# Patient Record
Sex: Male | Born: 1995 | Race: White | Hispanic: No | Marital: Single | Smoking: Never smoker
Health system: Southern US, Community
[De-identification: ages and names within clinical notes are randomized; demographics above are authoritative.]

---

## 2015-04-12 ENCOUNTER — Encounter (HOSPITAL_BASED_OUTPATIENT_CLINIC_OR_DEPARTMENT_OTHER): Payer: Self-pay | Admitting: Emergency Medicine

## 2015-04-12 ENCOUNTER — Emergency Department (HOSPITAL_BASED_OUTPATIENT_CLINIC_OR_DEPARTMENT_OTHER)
Admission: EM | Admit: 2015-04-12 | Discharge: 2015-04-12 | Disposition: A | Discharge status: Home / Self Care | Payer: Managed Care, Other (non HMO) | Attending: Emergency Medicine | Admitting: Emergency Medicine

## 2015-04-12 DIAGNOSIS — Z23 Encounter for immunization: Secondary | ICD-10-CM | POA: Insufficient documentation

## 2015-04-12 DIAGNOSIS — S61011A Laceration without foreign body of right thumb without damage to nail, initial encounter: Secondary | ICD-10-CM | POA: Diagnosis not present

## 2015-04-12 DIAGNOSIS — Y9389 Activity, other specified: Secondary | ICD-10-CM | POA: Diagnosis not present

## 2015-04-12 DIAGNOSIS — Y9289 Other specified places as the place of occurrence of the external cause: Secondary | ICD-10-CM | POA: Diagnosis not present

## 2015-04-12 DIAGNOSIS — W260XXA Contact with knife, initial encounter: Secondary | ICD-10-CM | POA: Insufficient documentation

## 2015-04-12 DIAGNOSIS — Y998 Other external cause status: Secondary | ICD-10-CM | POA: Insufficient documentation

## 2015-04-12 DIAGNOSIS — S61219A Laceration without foreign body of unspecified finger without damage to nail, initial encounter: Secondary | ICD-10-CM

## 2015-04-12 MED ORDER — TETANUS-DIPHTH-ACELL PERTUSSIS 5-2.5-18.5 LF-MCG/0.5 IM SUSP
0.5000 mL | Freq: Once | INTRAMUSCULAR | Status: AC
  Administered 2015-04-12: 0.5 mL via INTRAMUSCULAR
  Filled 2015-04-12: qty 0.5

## 2015-04-12 MED ORDER — LIDOCAINE HCL (PF) 1 % IJ SOLN
INTRAMUSCULAR | Status: AC
  Filled 2015-04-12: qty 5

## 2015-04-12 NOTE — Discharge Instructions (Signed)

## 2015-04-12 NOTE — ED Notes (Signed)
V shaped Lac to right thumb from new pocket knife.

## 2015-04-12 NOTE — ED Notes (Signed)
DC instructions reviewed with pt, discussed signs and symptoms of infection, pain control and when to return to ED if needed. Opportunity for questions provided

## 2015-04-12 NOTE — ED Notes (Signed)
PA at bedside.

## 2015-04-12 NOTE — ED Provider Notes (Signed)
CSN: [id number]     Arrival date & time 04/12/15  1708 History   First MD Initiated Contact with Patient 04/12/15 1951     Chief Complaint  Patient presents with  . Extremity Laceration     (Consider location/radiation/quality/duration/timing/severity/associated sxs/prior Treatment) HPI   Patient reports trying to cut off a security tag from a clothing item at home with a brand-new pocket knife when he accidentally slipped and cut his distal right thumb with a pocket knife. It was bleeding a lot but the bleeding has since improved. He denies continually having pain. No significant PMH, unsure about his tetanus. Denies any other injuries. Says knife was clean and new.  History reviewed. No pertinent past medical history. History reviewed. No pertinent past surgical history. No family history on file. Social History  Substance Use Topics  . Smoking status: Never Smoker   . Smokeless tobacco: None  . Alcohol Use: No    Review of Systems  Review of Systems All other systems negative except as documented in the HPI. All pertinent positives and negatives as reviewed in the HPI.   Allergies  Review of patient's allergies indicates no known allergies.  Home Medications   Prior to Admission medications   Not on File   BP 134/78 mmHg  Pulse 90  Temp(Src) 98.9 F (37.2 C) (Oral)  Resp 16  Ht 5\' 10"  (1.778 m)  Wt 102.059 kg  BMI 32.28 kg/m2  SpO2 100% Physical Exam  Constitutional: He appears well-developed and well-nourished. No distress.  HENT:  Head: Normocephalic and atraumatic.  Eyes: Pupils are equal, round, and reactive to light.  Neck: Normal range of motion. Neck supple.  Cardiovascular: Normal rate and regular rhythm.   Pulmonary/Chest: Effort normal.  Abdominal: Soft.  Musculoskeletal:  "V" shaped laceration to anterior distal portion of right thumb, approx 2 cm. Not actively bleeding. Neurovascularly intact and he has full range of motion.  Neurological: He  is alert.  Skin: Skin is warm and dry.  Nursing note and vitals reviewed.   ED Course  Procedures (including critical care time) Labs Review Labs Reviewed - No data to display  Imaging Review No results found. I have personally reviewed and evaluated these images and lab results as part of my medical decision-making.   EKG Interpretation None      MDM   Final diagnoses:  Finger laceration, initial encounter    LACERATION REPAIR Performed by: Dorthula MatasGREENE,Emerson Schreifels G Authorized by: Dorthula MatasGREENE,Sadiyah Kangas G Consent: Verbal consent obtained. Risks and benefits: risks, benefits and alternatives were discussed Consent given by: patient Patient identity confirmed: provided demographic data Prepped and Draped in normal sterile fashion Wound explored  Laceration Location: right thumb  Laceration Length:  2 cm  No Foreign Bodies seen or palpated  Anesthesia: local infiltration  Local anesthetic: lidocaine 1 % wo epinephrine  Anesthetic total: 2 ml  Irrigation method: syringe Amount of cleaning: standard  Skin closure: suture, prolene 4'0  Number of sutures: 7  Technique: simple interrupted.  Patient tolerance: Patient tolerated the procedure well with no immediate complications.  The wound is cleansed, debrided of foreign material as much as possible, and dressed. The patient is alerted to watch for any signs of infection (redness, pus, pain, increased swelling or fever) and call if such occurs. Home wound care instructions are provided. Tetanus vaccination status reviewed: Td vaccination indicated and given today. Stitches to be removed in 10-14 days.     Marlon Peliffany Murad Staples, PA-C 04/16/15 1421  Rolland PorterMark James, MD 04/24/15  0643 

## 2016-06-06 ENCOUNTER — Emergency Department (HOSPITAL_BASED_OUTPATIENT_CLINIC_OR_DEPARTMENT_OTHER): Payer: Managed Care, Other (non HMO)

## 2016-06-06 ENCOUNTER — Encounter (HOSPITAL_BASED_OUTPATIENT_CLINIC_OR_DEPARTMENT_OTHER): Payer: Self-pay | Admitting: Emergency Medicine

## 2016-06-06 ENCOUNTER — Emergency Department (HOSPITAL_BASED_OUTPATIENT_CLINIC_OR_DEPARTMENT_OTHER)
Admission: EM | Admit: 2016-06-06 | Discharge: 2016-06-06 | Disposition: A | Discharge status: Home / Self Care | Payer: Managed Care, Other (non HMO) | Attending: Emergency Medicine | Admitting: Emergency Medicine

## 2016-06-06 DIAGNOSIS — N200 Calculus of kidney: Secondary | ICD-10-CM | POA: Insufficient documentation

## 2016-06-06 DIAGNOSIS — R109 Unspecified abdominal pain: Secondary | ICD-10-CM | POA: Diagnosis present

## 2016-06-06 LAB — CBC WITH DIFFERENTIAL/PLATELET
Basophils Absolute: 0 10*3/uL (ref 0.0–0.1)
Basophils Relative: 0 %
EOS ABS: 0.1 10*3/uL (ref 0.0–0.7)
EOS PCT: 1 %
HCT: 41.4 % (ref 39.0–52.0)
Hemoglobin: 14.3 g/dL (ref 13.0–17.0)
LYMPHS ABS: 2.5 10*3/uL (ref 0.7–4.0)
Lymphocytes Relative: 29 %
MCH: 30.4 pg (ref 26.0–34.0)
MCHC: 34.5 g/dL (ref 30.0–36.0)
MCV: 88.1 fL (ref 78.0–100.0)
MONO ABS: 0.7 10*3/uL (ref 0.1–1.0)
MONOS PCT: 8 %
Neutro Abs: 5.3 10*3/uL (ref 1.7–7.7)
Neutrophils Relative %: 62 %
PLATELETS: 271 10*3/uL (ref 150–400)
RBC: 4.7 MIL/uL (ref 4.22–5.81)
RDW: 12.9 % (ref 11.5–15.5)
WBC: 8.7 10*3/uL (ref 4.0–10.5)

## 2016-06-06 LAB — URINALYSIS, MICROSCOPIC (REFLEX)

## 2016-06-06 LAB — URINALYSIS, ROUTINE W REFLEX MICROSCOPIC
Bilirubin Urine: NEGATIVE
GLUCOSE, UA: NEGATIVE mg/dL
Ketones, ur: NEGATIVE mg/dL
LEUKOCYTES UA: NEGATIVE
Nitrite: NEGATIVE
PH: 6 (ref 5.0–8.0)
Protein, ur: 30 mg/dL — AB
Specific Gravity, Urine: 1.027 (ref 1.005–1.030)

## 2016-06-06 LAB — COMPREHENSIVE METABOLIC PANEL
ALT: 82 U/L — AB (ref 17–63)
AST: 32 U/L (ref 15–41)
Albumin: 4.7 g/dL (ref 3.5–5.0)
Alkaline Phosphatase: 56 U/L (ref 38–126)
Anion gap: 8 (ref 5–15)
BUN: 9 mg/dL (ref 6–20)
CHLORIDE: 107 mmol/L (ref 101–111)
CO2: 25 mmol/L (ref 22–32)
CREATININE: 0.88 mg/dL (ref 0.61–1.24)
Calcium: 9.6 mg/dL (ref 8.9–10.3)
GFR calc Af Amer: >60 mL/min (ref >60)
GFR calc non Af Amer: >60 mL/min (ref >60)
GLUCOSE: 95 mg/dL (ref 65–99)
Potassium: 4 mmol/L (ref 3.5–5.1)
Sodium: 140 mmol/L (ref 135–145)
Total Bilirubin: 0.7 mg/dL (ref 0.3–1.2)
Total Protein: 7.7 g/dL (ref 6.5–8.1)

## 2016-06-06 MED ORDER — TAMSULOSIN HCL 0.4 MG PO CAPS: 0.4000 mg | ORAL_CAPSULE | Freq: Every day | ORAL | 0 refills | Status: AC

## 2016-06-06 MED ORDER — IBUPROFEN 800 MG PO TABS: 800.0000 mg | ORAL_TABLET | Freq: Three times a day (TID) | ORAL | 0 refills | Status: AC

## 2016-06-06 MED ORDER — HYDROCODONE-ACETAMINOPHEN 5-325 MG PO TABS: 1.0000 | ORAL_TABLET | Freq: Four times a day (QID) | ORAL | 0 refills | Status: DC | PRN

## 2016-06-06 MED ORDER — CEPHALEXIN 500 MG PO CAPS: 500.0000 mg | ORAL_CAPSULE | Freq: Four times a day (QID) | ORAL | 0 refills | Status: DC

## 2016-06-06 MED FILL — CEPHALEXIN 500 MG CAPSULE: 500 | 5 days supply | Qty: 20 | Fill #0

## 2016-06-06 MED FILL — IBUPROFEN 800 MG TABLET: 800 | 7 days supply | Qty: 21 | Fill #0

## 2016-06-06 MED FILL — TAMSULOSIN HCL 0.4 MG CAP: 0.4 | 30 days supply | Qty: 30 | Fill #0

## 2016-06-06 MED FILL — HYDROCODON-APAP 5-325: 5-325 | 4 days supply | Qty: 15 | Fill #0

## 2016-06-06 NOTE — ED Provider Notes (Signed)
[facility]   CSN: [id number] Arrival date & time: 06/06/16  1324     History   Chief Complaint Chief Complaint  Patient presents with  . Back Pain    HPI Brian Galloway is a 21 y.o. male.  HPI    Brian Galloway is a 21 y.o. male, patient with no pertinent past medical history, presenting to the ED with Sudden onset of right lower back pain that occurred a little before 9 AM this morning. Patient states that he was sitting on the toilet following a bowel movement when he began to have sudden sharp/stabbing, 10 out of 10, nonradiating right lower back pain. Patient states that he was not straining at the time and had finished with his bowel movement. Patient vomited due to the intensity of pain. The pain lasted about a half an hour during the patient's POV travel to the ED. Patient is currently pain-free. He is concerned about a possible kidney stone. He states he has no history of these, however, his father does. He denies fever/chills, urinary complaints, nausea/constipation/diarrhea, penile discharge, abdominal pain, or any other complaints.     No past medical history on file.  There are no active problems to display for this patient.   No past surgical history on file.     Home Medications    Prior to Admission medications   Medication Sig Start Date End Date Taking? Authorizing Provider  cephALEXin (KEFLEX) 500 MG capsule Take 1 capsule (500 mg total) by mouth 4 (four) times daily. 06/06/16   Parissa Chiao C Camila [place]ville, PA-C  HYDROcodone-acetaminophen (NORCO/VICODIN) 5-325 MG tablet Take 1 tablet by mouth every 6 (six) hours as needed. 06/06/16   Estephan Gallardo C Natausha Jungwirth, PA-C  ibuprofen (ADVIL,MOTRIN) 800 MG tablet Take 1 tablet (800 mg total) by mouth 3 (three) times daily. 06/06/16   Harleyquinn Gasser C Lynnea Vandervoort, PA-C  tamsulosin (FLOMAX) 0.4 MG CAPS capsule Take 1 capsule (0.4 mg total) by mouth daily. 06/06/16   Anselm Pancoast, PA-C    Family History No family history on  file.  Social History Social History  Substance Use Topics  . Smoking status: Never Smoker  . Smokeless tobacco: Never Used  . Alcohol use No     Allergies   Patient has no known allergies.   Review of Systems Review of Systems  Constitutional: Negative for chills and fever.  Respiratory: Negative for shortness of breath.   Cardiovascular: Negative for chest pain.  Gastrointestinal: Positive for vomiting (one episode). Negative for abdominal pain, constipation and diarrhea.  Genitourinary: Negative for difficulty urinating, discharge, dysuria, hematuria, penile pain, penile swelling, scrotal swelling and testicular pain.  Musculoskeletal: Positive for back pain.  All other systems reviewed and are negative.    Physical Exam Updated Vital Signs BP (!) 148/118 (BP Location: Left Arm)   Pulse 86   Temp 98.2 F (36.8 C) (Oral)   Resp 18   Ht 5\' 10"  (1.778 m)   Wt 108.9 kg   SpO2 100%   BMI 34.44 kg/m   Physical Exam  Constitutional: He appears well-developed and well-nourished. No distress.  HENT:  Head: Normocephalic and atraumatic.  Eyes: Conjunctivae are normal.  Neck: Neck supple.  Cardiovascular: Normal rate, regular rhythm, normal heart sounds and intact distal pulses.   Pulmonary/Chest: Effort normal and breath sounds normal. No respiratory distress.  Abdominal: Soft. There is no tenderness. There is no guarding and no CVA tenderness.  Musculoskeletal: He exhibits no edema or tenderness.  Lymphadenopathy:  He has no cervical adenopathy.  Neurological: He is alert.  Skin: Skin is warm and dry. He is not diaphoretic.  Psychiatric: He has a normal mood and affect. His behavior is normal.  Nursing note and vitals reviewed.    ED Treatments / Results  Labs (all labs ordered are listed, but only abnormal results are displayed) Labs Reviewed  URINALYSIS, ROUTINE W REFLEX MICROSCOPIC - Abnormal; Notable for the following:       Result Value   APPearance  CLOUDY (*)    Hgb urine dipstick LARGE (*)    Protein, ur 30 (*)    All other components within normal limits  COMPREHENSIVE METABOLIC PANEL - Abnormal; Notable for the following:    ALT 82 (*)    All other components within normal limits  URINALYSIS, MICROSCOPIC (REFLEX) - Abnormal; Notable for the following:    Bacteria, UA MANY (*)    Squamous Epithelial / LPF 0-5 (*)    All other components within normal limits  URINE CULTURE  CBC WITH DIFFERENTIAL/PLATELET    EKG  EKG Interpretation None       Radiology Ct Renal Stone Study  Result Date: 06/06/2016 CLINICAL DATA:  Right flank pain with hematuria EXAM: CT ABDOMEN AND PELVIS WITHOUT CONTRAST TECHNIQUE: Multidetector CT imaging of the abdomen and pelvis was performed following the standard protocol without oral or intravenous contrast material administration. COMPARISON:  None. FINDINGS: Lower chest: Lung bases are clear.  There is a small hiatal hernia. Hepatobiliary: Liver measures 22.5 cm in length. No focal liver lesions are evident on this noncontrast enhanced study. Gallbladder wall is not appreciably thickened. There is no biliary duct dilatation. Pancreas: No pancreatic mass or inflammatory focus. Spleen: No splenic lesions are evident. Adrenals/Urinary Tract: Adrenals appear unremarkable bilaterally. There is no renal mass on either side. There is slight hydronephrosis on the right. There is no hydronephrosis on the left. There is no intrarenal calculus on either side. There is a 2 mm calculus at the level of the mid sacrum in the right distal ureter. No other ureteral calculi evident on either side. Urinary bladder is midline with wall thickness within normal limits. Stomach/Bowel: There is no appreciable bowel wall or mesenteric thickening. There is no evident bowel obstruction. No free air or portal venous air. Vascular/Lymphatic: No abdominal aortic aneurysm. No vascular lesions are evident on this noncontrast enhanced study.  There are multiple lymph nodes in the right mid to lower quadrant region. These lymph nodes range in size from as small as 3 mm to as large as 1.1 x 1.0 cm. No other lymph node prominence is seen elsewhere in the abdomen or pelvis. Reproductive: Prostate and seminal vesicles appear normal in size and contour. No pelvic mass. Other: Appendix appears unremarkable. There is no ascites or abscess in the abdomen or pelvis. Musculoskeletal: There are no blastic or lytic bone lesions. No intramuscular or abdominal wall lesion. IMPRESSION: There is a 2 mm calculus at the mid sacral level in the right ureter with slight hydronephrosis on the right. No intrarenal calculi on either side. No hydronephrosis on the left. Multiple lymph nodes in the right mid to lower abdomen noted. In the appropriate clinical setting, this finding could indicate a degree of mesenteric adenitis. An atypical presentation of lymphoma is also possible felt to be less likely. No bowel obstruction. No abscess. Prominent liver without focal lesion. Small hiatal hernia. Electronically Signed   By: Bretta BangWilliam  Woodruff III M.D.   On: 06/06/2016 12:15  Procedures Procedures (including critical care time)  Medications Ordered in ED Medications - No data to display   Initial Impression / Assessment and Plan / ED Course  I have reviewed the triage vital signs and the nursing notes.  Pertinent labs & imaging results that were available during my care of the patient were reviewed by me and considered in my medical decision making (see chart for details).     Patient presents with an episode of right lower back pain. Renal stone noted in the right ureter with minor hydronephrosis. Reviewed CT and CBC. Based on the review of these and the patient's physical exam, doubt lymphoma or mesenteric adenitis at this time. Patient likely able to follow up with urologist in the office.   1:44 PM Spoke with Dr. Vernie Ammons, urologist, who agrees with the plan  for office follow-up. Recommends Flomax, antibiotic, and pain management for discharge. This information was communicated with the patient. Return precautions discussed. Patient voices understanding of all instructions and is comfortable with discharge.   Findings and plan of care discussed with Doug Sou, MD.   Vitals:   06/06/16 0955 06/06/16 1215 06/06/16 1359  BP: (!) 148/118 (!) 163/104 (!) 152/111  Pulse: 86 104 84  Resp: 18 18 18   Temp: 98.2 F (36.8 C)    TempSrc: Oral    SpO2: 100% 97% 99%  Weight: 108.9 kg    Height: 5\' 10"  (1.778 m)     Patient's hypertension was noted discussed with the patient. Patient states that his blood pressure rises whenever he is in clinical setting. He has no symptoms of hypertensive emergency or end organ damage.  Final Clinical Impressions(s) / ED Diagnoses   Final diagnoses:  Renal stone    New Prescriptions Discharge Medication List as of 06/06/2016  1:53 PM    START taking these medications   Details  cephALEXin (KEFLEX) 500 MG capsule Take 1 capsule (500 mg total) by mouth 4 (four) times daily., Starting Fri 06/06/2016, Print    HYDROcodone-acetaminophen (NORCO/VICODIN) 5-325 MG tablet Take 1 tablet by mouth every 6 (six) hours as needed., Starting Fri 06/06/2016, Print    ibuprofen (ADVIL,MOTRIN) 800 MG tablet Take 1 tablet (800 mg total) by mouth 3 (three) times daily., Starting Fri 06/06/2016, Print    tamsulosin (FLOMAX) 0.4 MG CAPS capsule Take 1 capsule (0.4 mg total) by mouth daily., Starting Fri 06/06/2016, Print         Anselm Pancoast, PA-C 06/06/16 1642    Doug Sou, MD 06/06/16 1650

## 2016-06-06 NOTE — ED Triage Notes (Signed)
Sudden sharp lower back pain today.  Some nausea at time.  Pain has subsided since then.  Pt concerned it is kidney stone.  No history.

## 2016-06-06 NOTE — Discharge Instructions (Signed)
You have a renal stone noted on the CT scan. Please take the tamsulosin daily until the stone passes. Ibuprofen for pain. Vicodin for severe pain. Do not drive or perform other dangerous activities while taking the Vicodin. Please take all of your antibiotics until finished!   You may develop abdominal discomfort or diarrhea from the antibiotic.  You may help offset this with probiotics which you can buy or get in yogurt. Do not eat or take the probiotics until 2 hours after your antibiotic.  Please follow up with the urologist. Call the number provided to set up an appointment.

## 2016-06-07 LAB — URINE CULTURE: Culture: NO GROWTH

## 2017-08-31 ENCOUNTER — Other Ambulatory Visit: Payer: Self-pay

## 2017-08-31 ENCOUNTER — Emergency Department (HOSPITAL_BASED_OUTPATIENT_CLINIC_OR_DEPARTMENT_OTHER)
Admission: EM | Admit: 2017-08-31 | Discharge: 2017-08-31 | Disposition: A | Discharge status: Home / Self Care | Payer: Managed Care, Other (non HMO) | Attending: Emergency Medicine | Admitting: Emergency Medicine

## 2017-08-31 ENCOUNTER — Emergency Department (HOSPITAL_BASED_OUTPATIENT_CLINIC_OR_DEPARTMENT_OTHER): Payer: Managed Care, Other (non HMO)

## 2017-08-31 ENCOUNTER — Encounter (HOSPITAL_BASED_OUTPATIENT_CLINIC_OR_DEPARTMENT_OTHER): Payer: Self-pay

## 2017-08-31 DIAGNOSIS — X500XXA Overexertion from strenuous movement or load, initial encounter: Secondary | ICD-10-CM | POA: Diagnosis not present

## 2017-08-31 DIAGNOSIS — Z79899 Other long term (current) drug therapy: Secondary | ICD-10-CM | POA: Diagnosis not present

## 2017-08-31 DIAGNOSIS — Y929 Unspecified place or not applicable: Secondary | ICD-10-CM | POA: Diagnosis not present

## 2017-08-31 DIAGNOSIS — Y9367 Activity, basketball: Secondary | ICD-10-CM | POA: Diagnosis not present

## 2017-08-31 DIAGNOSIS — S86002A Unspecified injury of left Achilles tendon, initial encounter: Secondary | ICD-10-CM | POA: Diagnosis not present

## 2017-08-31 DIAGNOSIS — Y999 Unspecified external cause status: Secondary | ICD-10-CM | POA: Insufficient documentation

## 2017-08-31 DIAGNOSIS — S99912A Unspecified injury of left ankle, initial encounter: Secondary | ICD-10-CM | POA: Diagnosis present

## 2017-08-31 MED ORDER — ONDANSETRON 4 MG PO TBDP
4.0000 mg | ORAL_TABLET | Freq: Once | ORAL | Status: AC
  Administered 2017-08-31: 4 mg via ORAL

## 2017-08-31 MED ORDER — OXYCODONE-ACETAMINOPHEN 5-325 MG PO TABS
1.0000 | ORAL_TABLET | Freq: Once | ORAL | Status: AC
  Administered 2017-08-31: 1 via ORAL
  Filled 2017-08-31: qty 1

## 2017-08-31 MED ORDER — IBUPROFEN 400 MG PO TABS
600.0000 mg | ORAL_TABLET | Freq: Once | ORAL | Status: AC
  Administered 2017-08-31: 600 mg via ORAL
  Filled 2017-08-31: qty 1

## 2017-08-31 MED ORDER — HYDROCODONE-ACETAMINOPHEN 5-325 MG PO TABS: 1.0000 | ORAL_TABLET | Freq: Four times a day (QID) | ORAL | 0 refills | Status: AC | PRN

## 2017-08-31 MED ORDER — ONDANSETRON 4 MG PO TBDP
ORAL_TABLET | ORAL | Status: AC
  Filled 2017-08-31: qty 1

## 2017-08-31 NOTE — ED Triage Notes (Signed)
C/o acute pain to left achilles tendon approx 5pm playing basketball-NAD-presents to triage in w/c

## 2017-08-31 NOTE — Discharge Instructions (Signed)
There were no bony abnormalities on x-ray, however, there was evidence of an Achilles tendon disruption on exam. Pain: Take 600 mg of ibuprofen every 6 hours or 440 mg (over the counter dose) to 500 mg (prescription dose) of naproxen every 12 hours for the next 3 days. After this time, these medications may be used as needed for pain. Take these medications with food to avoid upset stomach. Choose only one of these medications, do not take them together.  Tylenol: Should you continue to have additional pain while taking the ibuprofen or naproxen, you may add in tylenol as needed. Your daily total maximum amount of tylenol from all sources should be limited to /day for persons without liver problems, or /day for those with liver problems. Vicodin: May take Vicodin as needed for severe pain.  Do not drive or perform other dangerous activities while taking the Vicodin.  Please note that each pill of Vicodin contains 325 mg of Tylenol and the above dosage limits apply. Ice: May apply ice to the area over the next 24 hours for 15 minutes at a time to reduce swelling. Elevation: Keep the extremity elevated as often as possible to reduce pain and inflammation. Support: You must use the crutches to get around.  You will be strictly nonweightbearing. Follow up: Follow-up with the orthopedic specialist as soon as possible on this matter.

## 2017-08-31 NOTE — ED Provider Notes (Signed)
[facility] EMERGENCY DEPARTMENT Provider Note   CSN: [id number] Arrival date & time: 08/31/17  1843     History   Chief Complaint Chief Complaint  Patient presents with  . Tendonitis    HPI Brian Galloway is a 22 y.o. male.  HPI  Brian Galloway is a 22 y.o. male, presenting to the ED with a left ankle injury that occurred at approximately 5 PM this evening.  States he was playing basketball, jumped, and when he landed he felt a pop in the back of the left ankle and had sudden pain.  Pain is described as a dull ache, 8/10, radiating proximally into the calf. States he can not dorsiflex or plantarflex his foot.  No recent antibiotic use.  No previous ankle or Achilles injuries. Denies numbness, other injuries, or any other complaints.   History reviewed. No pertinent past medical history.  There are no active problems to display for this patient.   History reviewed. No pertinent surgical history.      Home Medications    Prior to Admission medications   Medication Sig Start Date End Date Taking? Authorizing Provider  HYDROcodone-acetaminophen (NORCO/VICODIN) 5-325 MG tablet Take 1-2 tablets by mouth every 6 (six) hours as needed for severe pain. 08/31/17   Lennard Capek C, PA-C  ibuprofen (ADVIL,MOTRIN) 800 MG tablet Take 1 tablet (800 mg total) by mouth 3 (three) times daily. 06/06/16   Braeleigh Pyper C, PA-C  tamsulosin (FLOMAX) 0.4 MG CAPS capsule Take 1 capsule (0.4 mg total) by mouth daily. 06/06/16   Anselm Pancoast, PA-C    Family History No family history on file.  Social History Social History   Tobacco Use  . Smoking status: Never Smoker  . Smokeless tobacco: Never Used  Substance Use Topics  . Alcohol use: No  . Drug use: No     Allergies   Patient has no known allergies.   Review of Systems Review of Systems  Musculoskeletal: Positive for arthralgias.  Neurological: Positive for weakness. Negative for numbness.     Physical Exam Updated  Vital Signs BP (!) 138/96 (BP Location: Left Arm)   Pulse (!) 114   Temp 98 F (36.7 C) (Oral)   Resp 18   Ht  (1.803 m)   Wt 100.7 kg (222 lb)   SpO2 100%   BMI 30.96 kg/m   Physical Exam  Constitutional: He appears well-developed and well-nourished. No distress.  HENT:  Head: Normocephalic and atraumatic.  Eyes: Conjunctivae are normal.  Neck: Neck supple.  Cardiovascular: Normal rate, regular rhythm and intact distal pulses.  Pulses:      Dorsalis pedis pulses are 2+ on the right side, and 2+ on the left side.       Posterior tibial pulses are 2+ on the right side, and 2+ on the left side.  Pulmonary/Chest: Effort normal.  Musculoskeletal: He exhibits edema and tenderness.       Feet:  Tenderness over the left Achilles, extending into the left calf.  Swelling in area indicated.  Patient is unable to plantar flex the left foot.  Dorsiflexion intact.  Neurological: He is alert.  Motor function intact in the toes of the left foot.  Sensation to light touch also intact.  Skin: Skin is warm and dry. Capillary refill takes less than 2 seconds. He is not diaphoretic. No pallor.  Temperature similar in bilateral feet.  Psychiatric: He has a normal mood and affect. His behavior is normal.  Nursing note  and vitals reviewed.    ED Treatments / Results  Labs (all labs ordered are listed, but only abnormal results are displayed) Labs Reviewed - No data to display  EKG None  Radiology Dg Ankle Complete Left  Result Date: 08/31/2017 CLINICAL DATA:  Ankle injury 5 hours ago playing basketball. Achilles tendon pain. EXAM: LEFT ANKLE COMPLETE - 3+ VIEW COMPARISON:  None. FINDINGS: Ankle joint appears normal. Distal tibia and fibula appear normal. Bones of the hindfoot appear normal. There does appear to be some thickening of the Achilles shadow with some edema in the pre Achilles fat. IMPRESSION: No bone or joint abnormality. Possible findings of Achilles injury as above.  Electronically Signed   By: Paulina Fusi M.D.   On: 08/31/2017 21:51    Procedures .Splint Application Date/Time: 09/01/2017 10:30 PM Performed by: Anselm Pancoast, PA-C Authorized by: Anselm Pancoast, PA-C   Consent:    Consent obtained:  Verbal   Consent given by:  Patient Pre-procedure details:    Sensation:  Normal   Skin color:  Normal Procedure details:    Laterality:  Left   Location:  Ankle   Ankle:  L ankle   Splint type:  Short leg   Supplies:  Cotton padding, elastic bandage and plaster Post-procedure details:    Pain:  Improved   Sensation:  Normal   Skin color:  Normal   Patient tolerance of procedure:  Tolerated well, no immediate complications Comments:     Procedure was performed by the Med Tech with my evaluation before and after. I was available for consultation throughout the procedure.   (including critical care time)  Medications Ordered in ED Medications  oxyCODONE-acetaminophen (PERCOCET/ROXICET) 5-325 MG per tablet 1 tablet (1 tablet Oral Given 08/31/17 2128)  ibuprofen (ADVIL,MOTRIN) tablet 600 mg (600 mg Oral Given 08/31/17 2128)  ondansetron (ZOFRAN-ODT) disintegrating tablet 4 mg (4 mg Oral Given 08/31/17 2128)     Initial Impression / Assessment and Plan / ED Course  I have reviewed the triage vital signs and the nursing notes.  Pertinent labs & imaging results that were available during my care of the patient were reviewed by me and considered in my medical decision making (see chart for details).     Patient presents with left ankle injury.  Tenderness over left Achilles with associated swelling.  Unable to purposefully plantar flex.  Suspect Achilles disruption.  Patient splinted and position of comfort, slightly plantarflexed.  Orthopedic follow-up. The patient was given instructions for home care as well as return precautions. Patient voices understanding of these instructions, accepts the plan, and is comfortable with discharge.  Findings and  plan of care discussed with Shaune Pollack, MD.  Final Clinical Impressions(s) / ED Diagnoses   Final diagnoses:  Injury of left Achilles tendon, initial encounter    ED Discharge Orders        Ordered    HYDROcodone-acetaminophen (NORCO/VICODIN) 5-325 MG tablet  Every 6 hours PRN     08/31/17 2247       Anselm Pancoast, PA-C 09/02/17 0236    Shaune Pollack, MD 09/02/17 1013

## 2019-08-22 IMAGING — DX DG ANKLE COMPLETE 3+V*L*
3 series · 3 of 3 positions shown · non-contrast
Comparison: None.

CLINICAL DATA: Ankle injury 5 hours ago playing basketball.
Achilles tendon pain.

EXAM:
LEFT ANKLE COMPLETE - 3+ VIEW

[ankle ap]
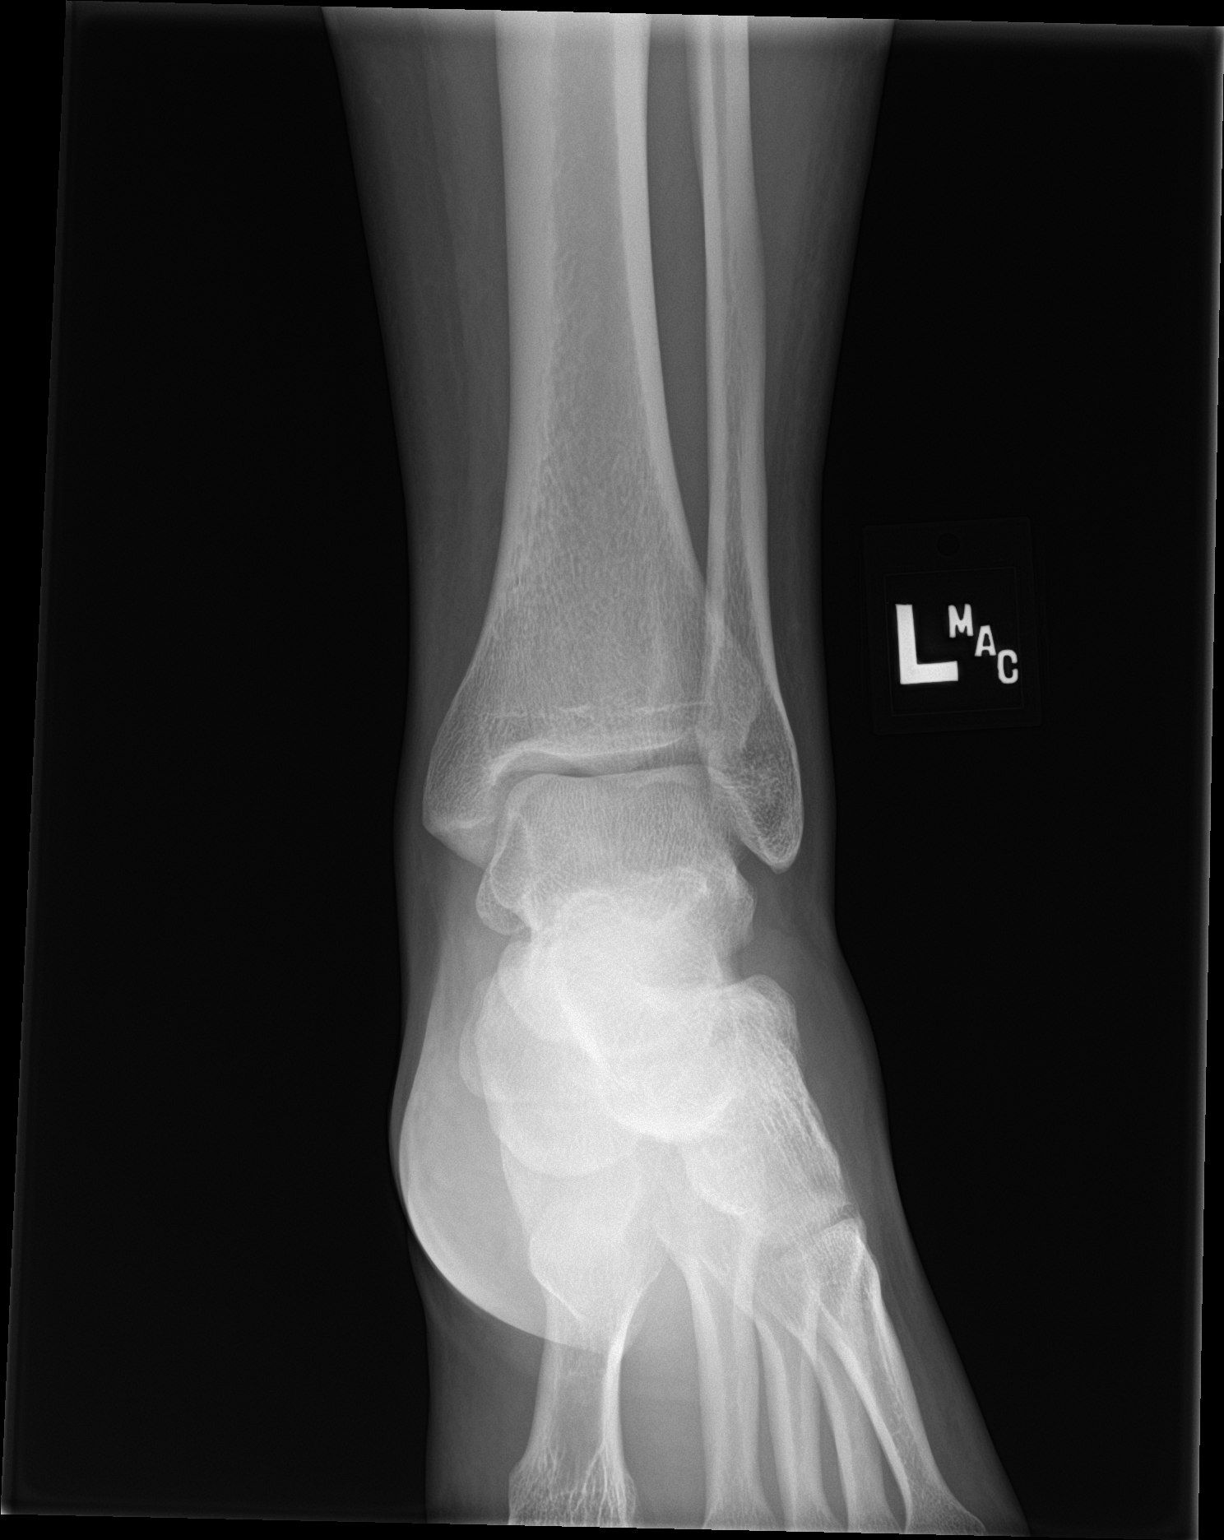

[ankle obl]
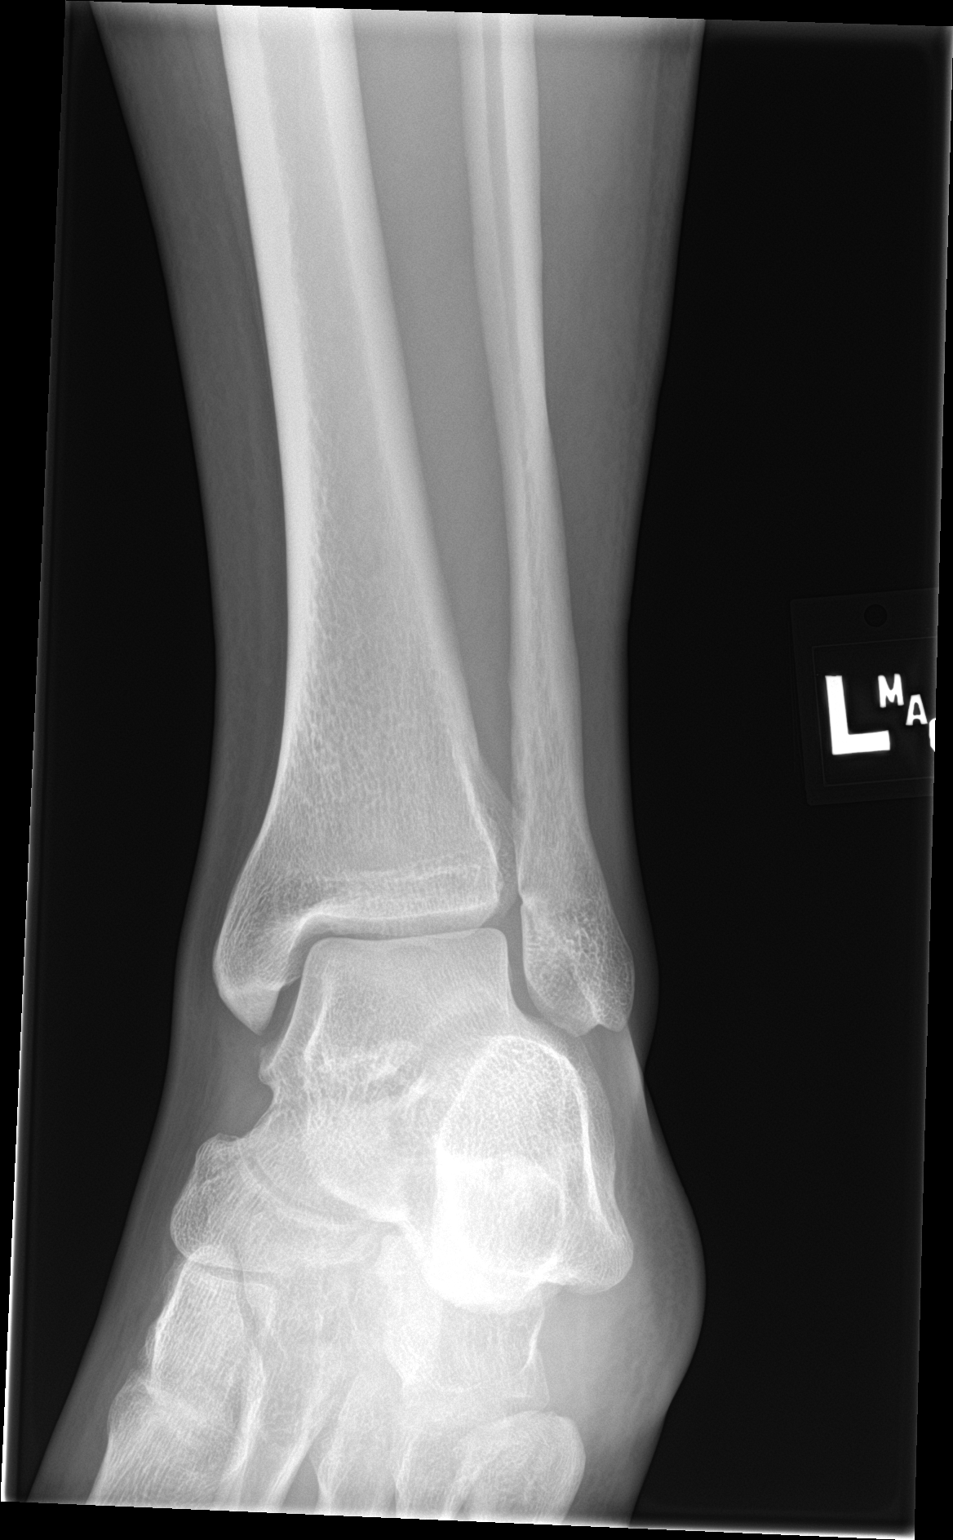

[ankle lat]
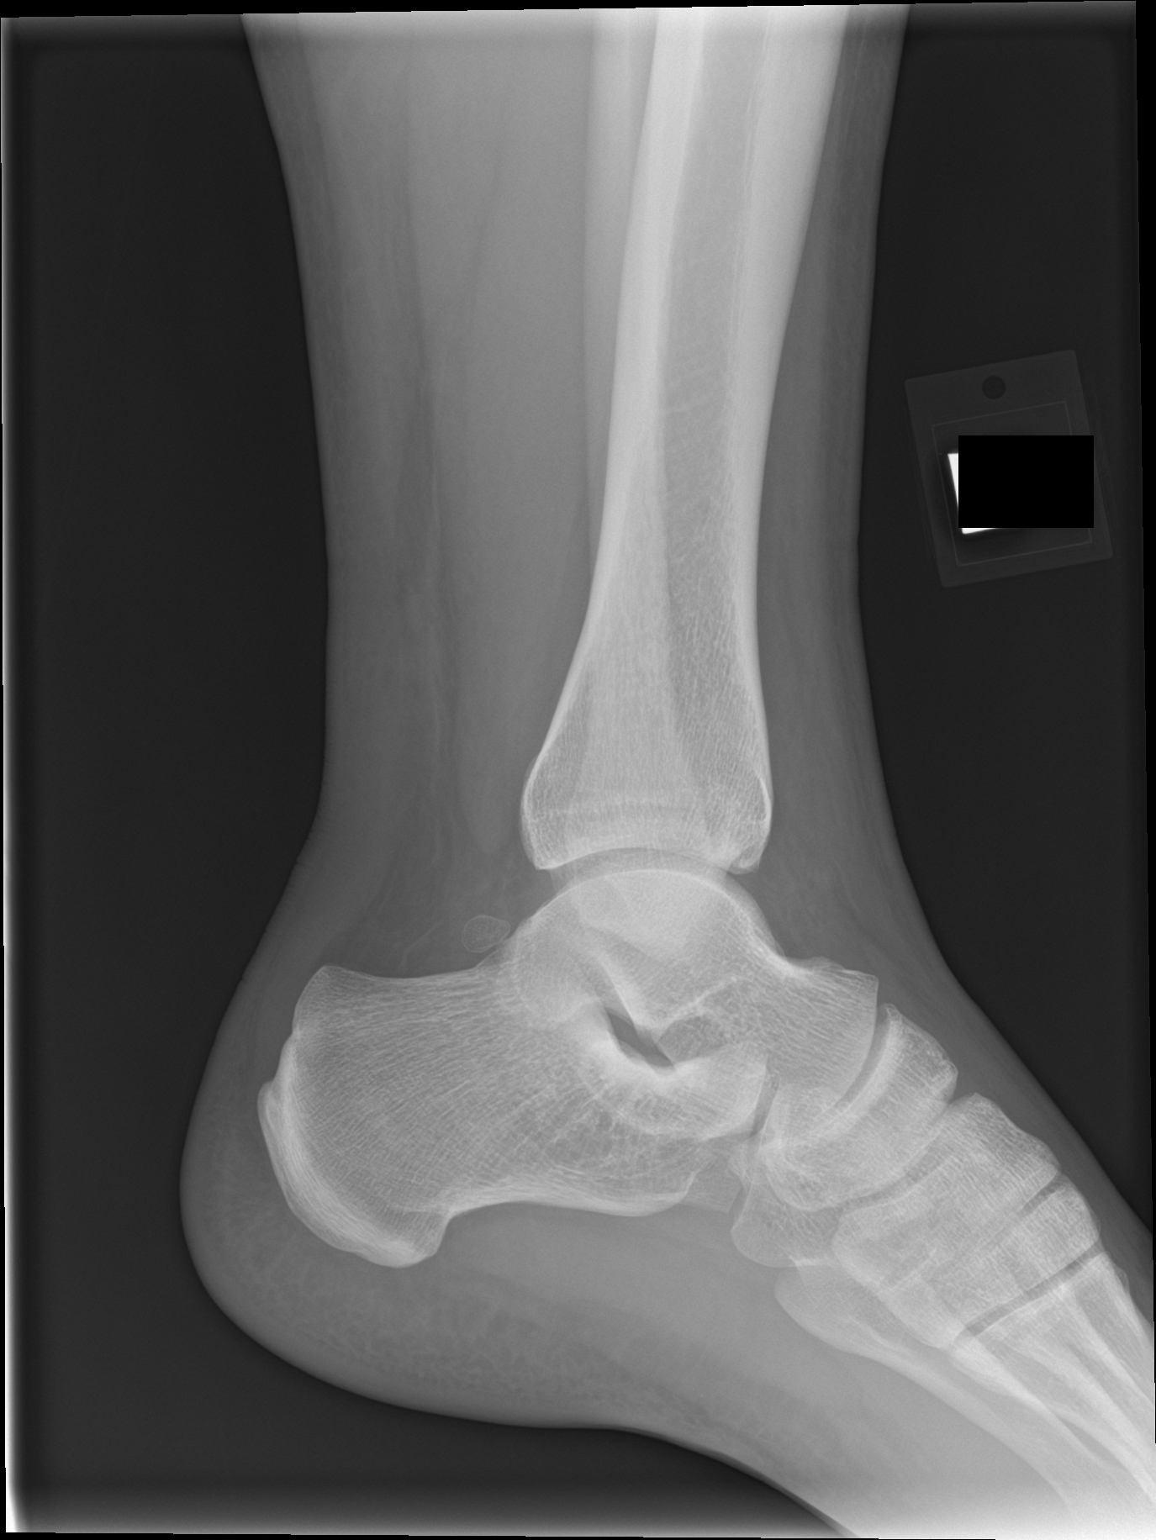

[3 of 3 positions shown; findings below may reference images not displayed]

FINDINGS: Ankle joint appears normal. Distal tibia and fibula appear normal.
Bones of the hindfoot appear normal. There does appear to be some
thickening of the Achilles shadow with some edema in the pre
Achilles fat.
IMPRESSION: No bone or joint abnormality. Possible findings of Achilles injury
as above.
